# Patient Record
Sex: Female | Born: 1960 | Hispanic: No | Marital: Single
Health system: Southern US, Community
[De-identification: ages and names within clinical notes are randomized; demographics above are authoritative.]

---

## 2006-08-29 ENCOUNTER — Ambulatory Visit: Payer: Self-pay | Admitting: Obstetrics and Gynecology

## 2008-01-02 ENCOUNTER — Ambulatory Visit: Payer: Self-pay | Admitting: Internal Medicine

## 2008-03-22 ENCOUNTER — Ambulatory Visit: Payer: Self-pay | Admitting: Unknown Physician Specialty

## 2009-03-28 ENCOUNTER — Ambulatory Visit: Payer: Self-pay | Admitting: Unknown Physician Specialty

## 2010-03-30 ENCOUNTER — Ambulatory Visit: Payer: Self-pay | Admitting: Cardiovascular Disease

## 2011-04-18 ENCOUNTER — Ambulatory Visit: Payer: Self-pay | Admitting: Unknown Physician Specialty

## 2012-02-08 ENCOUNTER — Ambulatory Visit: Payer: Self-pay

## 2012-02-08 LAB — RAPID STREP-A WITH REFLX: Micro Text Report: NEGATIVE

## 2013-08-25 ENCOUNTER — Ambulatory Visit: Payer: Self-pay | Admitting: Family Medicine

## 2013-09-22 ENCOUNTER — Ambulatory Visit: Payer: Self-pay | Admitting: Emergency Medicine

## 2015-05-30 ENCOUNTER — Other Ambulatory Visit: Payer: Self-pay | Admitting: Family Medicine

## 2015-05-30 DIAGNOSIS — Z1231 Encounter for screening mammogram for malignant neoplasm of breast: Secondary | ICD-10-CM

## 2015-06-02 ENCOUNTER — Ambulatory Visit: Payer: Self-pay | Attending: Family Medicine

## 2015-06-14 ENCOUNTER — Ambulatory Visit: Payer: Self-pay

## 2015-06-14 ENCOUNTER — Ambulatory Visit
Admission: RE | Admit: 2015-06-14 | Discharge: 2015-06-14 | Disposition: A | Discharge status: Home / Self Care | Payer: BLUE CROSS/BLUE SHIELD | Source: Ambulatory Visit | Attending: Family Medicine | Admitting: Family Medicine

## 2015-06-14 DIAGNOSIS — Z1231 Encounter for screening mammogram for malignant neoplasm of breast: Secondary | ICD-10-CM | POA: Diagnosis not present

## 2016-08-03 ENCOUNTER — Other Ambulatory Visit: Payer: Self-pay | Admitting: Family Medicine

## 2016-08-03 DIAGNOSIS — Z1231 Encounter for screening mammogram for malignant neoplasm of breast: Secondary | ICD-10-CM

## 2016-08-08 ENCOUNTER — Ambulatory Visit: Admission: RE | Admit: 2016-08-08 | Payer: BLUE CROSS/BLUE SHIELD | Source: Ambulatory Visit

## 2016-08-24 ENCOUNTER — Ambulatory Visit: Payer: BLUE CROSS/BLUE SHIELD

## 2017-07-29 ENCOUNTER — Other Ambulatory Visit: Payer: Self-pay | Admitting: Family Medicine

## 2017-07-29 DIAGNOSIS — Z1231 Encounter for screening mammogram for malignant neoplasm of breast: Secondary | ICD-10-CM

## 2017-08-05 ENCOUNTER — Inpatient Hospital Stay: Admission: RE | Admit: 2017-08-05 | Payer: BLUE CROSS/BLUE SHIELD | Source: Ambulatory Visit

## 2017-08-06 ENCOUNTER — Other Ambulatory Visit: Payer: Self-pay | Admitting: Family Medicine

## 2017-08-06 ENCOUNTER — Ambulatory Visit: Payer: BLUE CROSS/BLUE SHIELD

## 2017-08-06 DIAGNOSIS — Z1239 Encounter for other screening for malignant neoplasm of breast: Secondary | ICD-10-CM

## 2017-09-04 ENCOUNTER — Ambulatory Visit
Admission: RE | Admit: 2017-09-04 | Discharge: 2017-09-04 | Disposition: A | Discharge status: Home / Self Care | Payer: BLUE CROSS/BLUE SHIELD | Source: Ambulatory Visit | Attending: Family Medicine | Admitting: Family Medicine

## 2017-09-04 DIAGNOSIS — Z1231 Encounter for screening mammogram for malignant neoplasm of breast: Secondary | ICD-10-CM | POA: Insufficient documentation

## 2019-08-13 ENCOUNTER — Other Ambulatory Visit: Payer: Self-pay | Admitting: Family Medicine

## 2019-08-13 DIAGNOSIS — Z1231 Encounter for screening mammogram for malignant neoplasm of breast: Secondary | ICD-10-CM

## 2019-08-31 ENCOUNTER — Encounter (INDEPENDENT_AMBULATORY_CARE_PROVIDER_SITE_OTHER): Payer: Self-pay

## 2019-08-31 ENCOUNTER — Other Ambulatory Visit: Payer: Self-pay

## 2019-08-31 ENCOUNTER — Ambulatory Visit
Admission: RE | Admit: 2019-08-31 | Discharge: 2019-08-31 | Disposition: A | Discharge status: Home / Self Care | Payer: BLUE CROSS/BLUE SHIELD | Source: Ambulatory Visit | Attending: Family Medicine | Admitting: Family Medicine

## 2019-08-31 DIAGNOSIS — Z1231 Encounter for screening mammogram for malignant neoplasm of breast: Secondary | ICD-10-CM | POA: Diagnosis not present

## 2021-01-11 ENCOUNTER — Other Ambulatory Visit: Payer: Self-pay | Admitting: Family Medicine

## 2021-01-11 DIAGNOSIS — Z1231 Encounter for screening mammogram for malignant neoplasm of breast: Secondary | ICD-10-CM

## 2021-01-26 ENCOUNTER — Ambulatory Visit
Admission: RE | Admit: 2021-01-26 | Discharge: 2021-01-26 | Disposition: A | Discharge status: Home / Self Care | Payer: BLUE CROSS/BLUE SHIELD | Source: Ambulatory Visit | Attending: Family Medicine | Admitting: Family Medicine

## 2021-01-26 ENCOUNTER — Other Ambulatory Visit: Payer: Self-pay

## 2021-01-26 DIAGNOSIS — Z1231 Encounter for screening mammogram for malignant neoplasm of breast: Secondary | ICD-10-CM | POA: Diagnosis present

## 2021-08-03 ENCOUNTER — Telehealth: Payer: Self-pay

## 2021-08-03 NOTE — Telephone Encounter (Signed)
This patient called the office and spoke with Ladona Ridgel yesterday: "Kim Valentine called to get an appointment, there was no record of her being here in [facility] or [facility]. She stated that she wanted me to let DR. Roseanne Reno know that she needs to be available more for her patients that are dealing with issues and need to be seen, and that she will be calling back to see if "I passed her message along". Told her there was nothing I could do about the schedule or that Dr. Roseanne Reno is only here Monday and Tuesday, maybe some Wednesdays."  Patient is not in [facility] or [facility] so she would be considered a New Patient and at this time we are scheduling in February.

## 2022-09-07 ENCOUNTER — Other Ambulatory Visit: Payer: Self-pay | Admitting: Family Medicine

## 2022-09-07 DIAGNOSIS — Z1231 Encounter for screening mammogram for malignant neoplasm of breast: Secondary | ICD-10-CM

## 2022-09-27 ENCOUNTER — Ambulatory Visit: Payer: BLUE CROSS/BLUE SHIELD

## 2023-02-09 IMAGING — MG MM DIGITAL SCREENING BILAT W/ TOMO AND CAD
8 series · 8 of 24 positions shown · non-contrast
Comparison: Previous exam(s).

CLINICAL DATA: Screening.

EXAM:
DIGITAL SCREENING BILATERAL MAMMOGRAM WITH TOMOSYNTHESIS AND CAD
TECHNIQUE: Bilateral screening digital craniocaudal and mediolateral oblique
mammograms were obtained. Bilateral screening digital breast
tomosynthesis was performed. The images were evaluated with
computer-aided detection.

[L CC synth-2D]
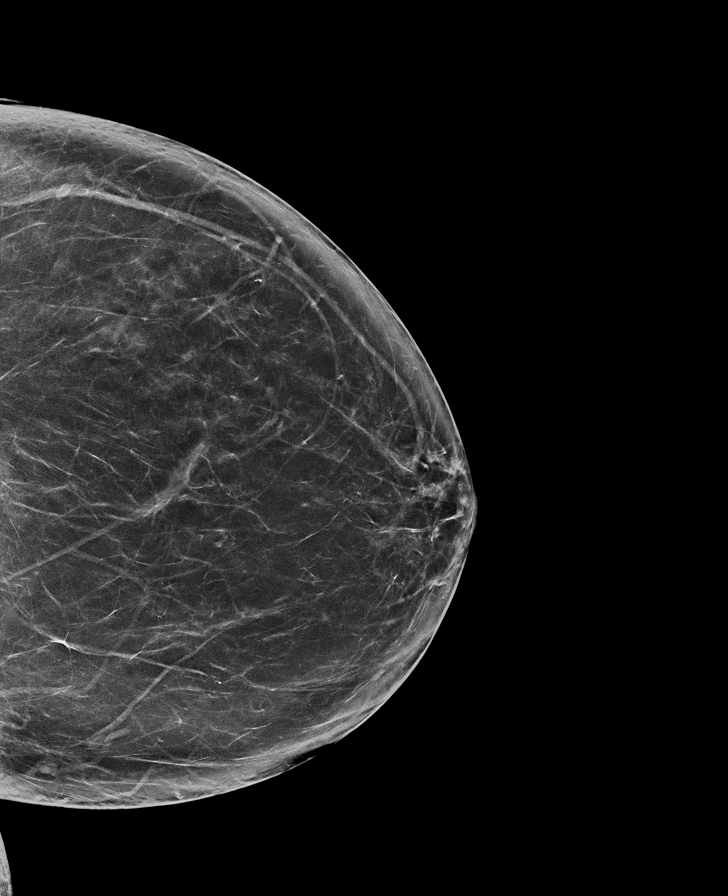

[R MLO synth-2D]
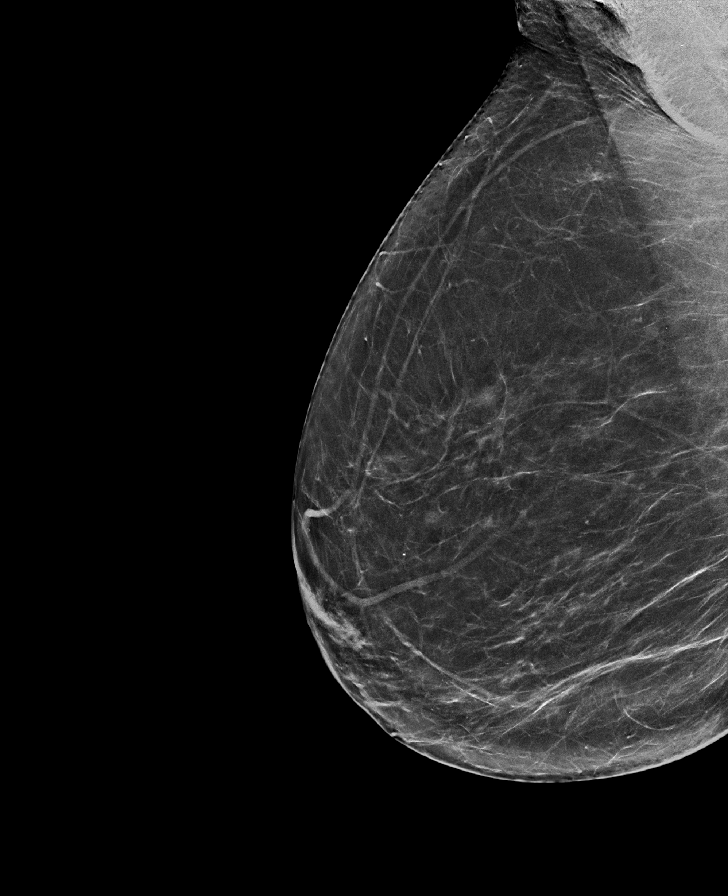

[R CC synth-2D]
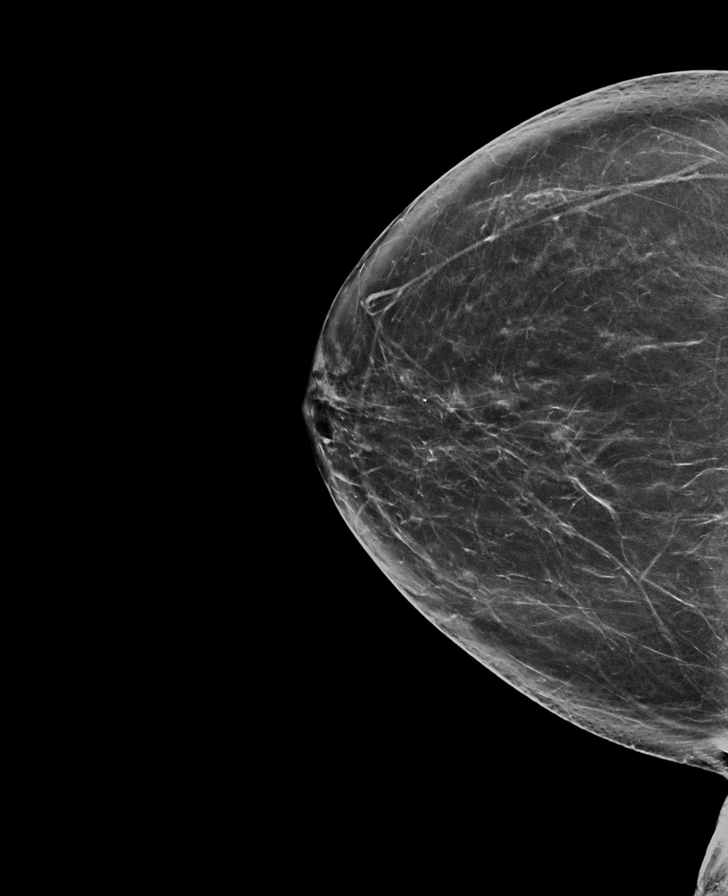

[L MLO synth-2D]
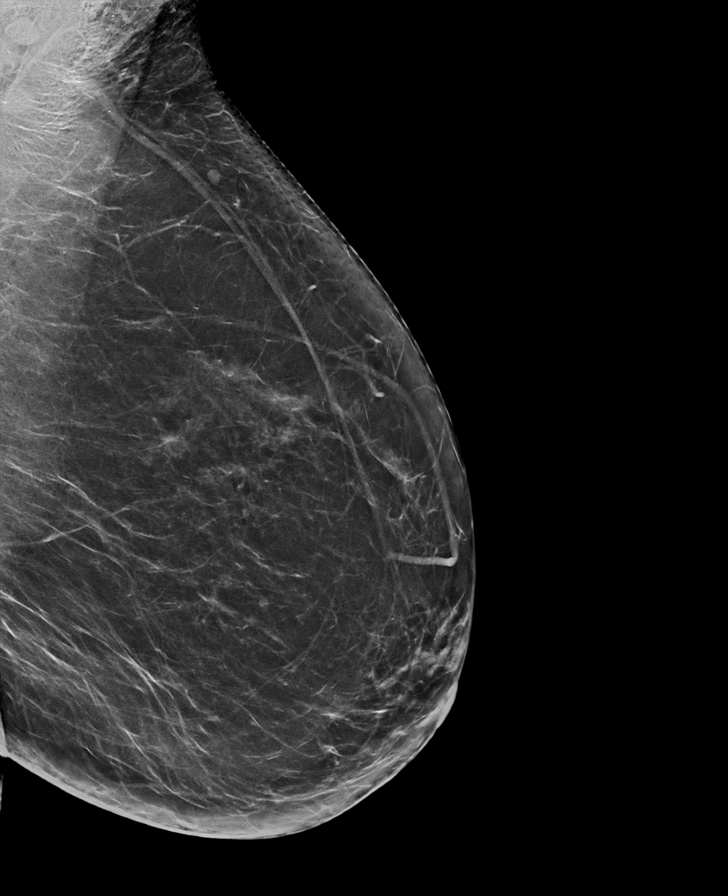

[R CC tomo · tomo slice 37/72.0]
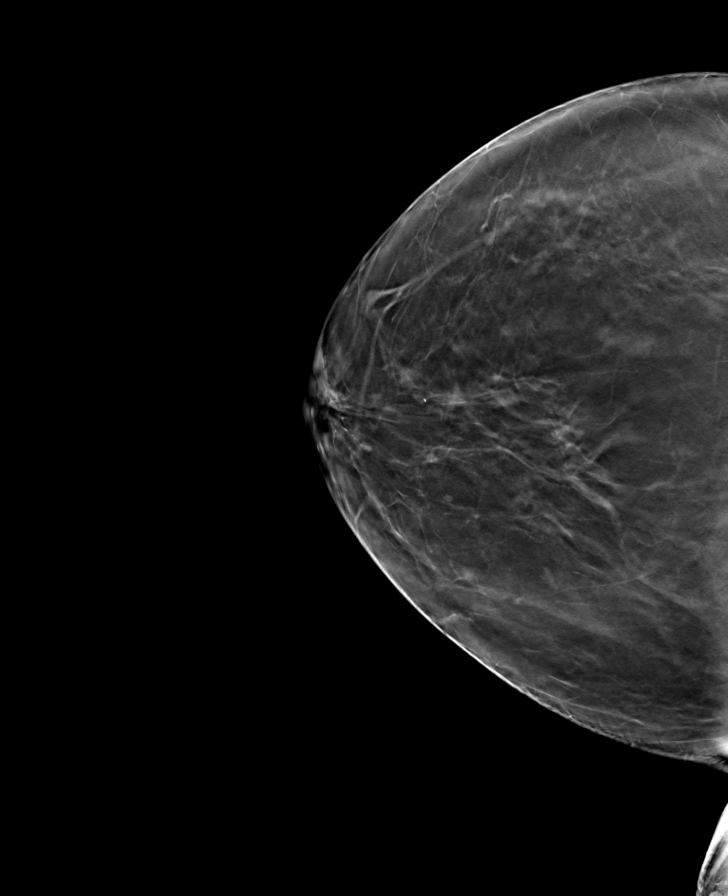

[L CC tomo · tomo slice 39/77.0]
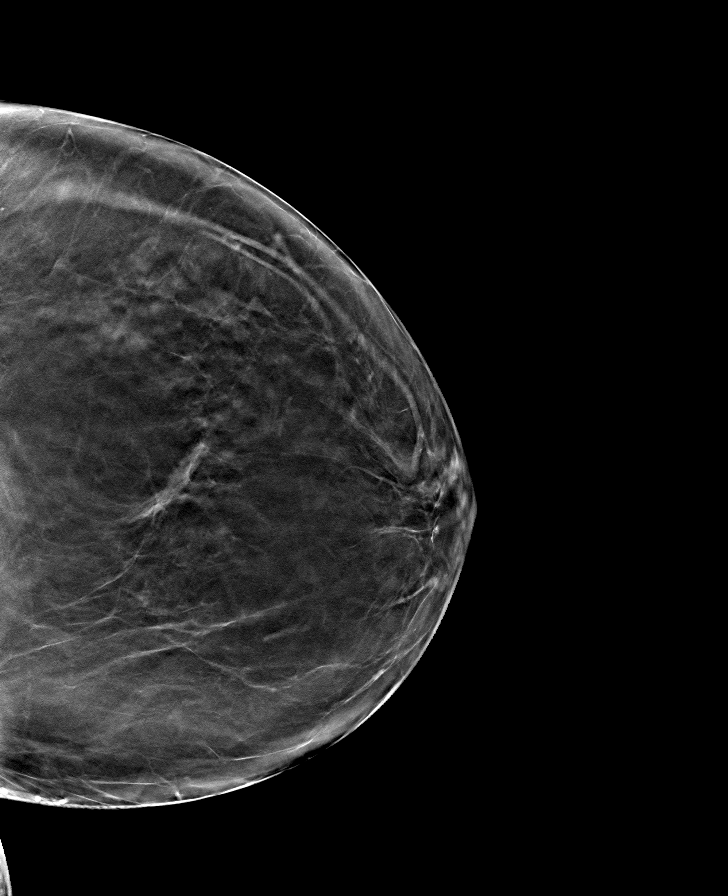

[L MLO tomo · tomo slice 39/77.0]
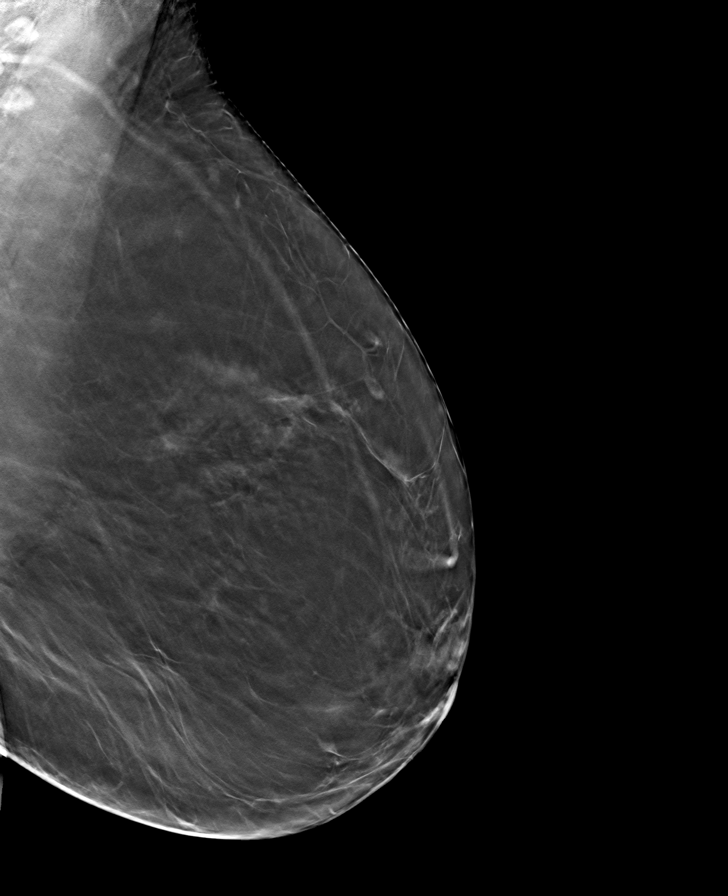

[R MLO tomo · tomo slice 37/72.0]
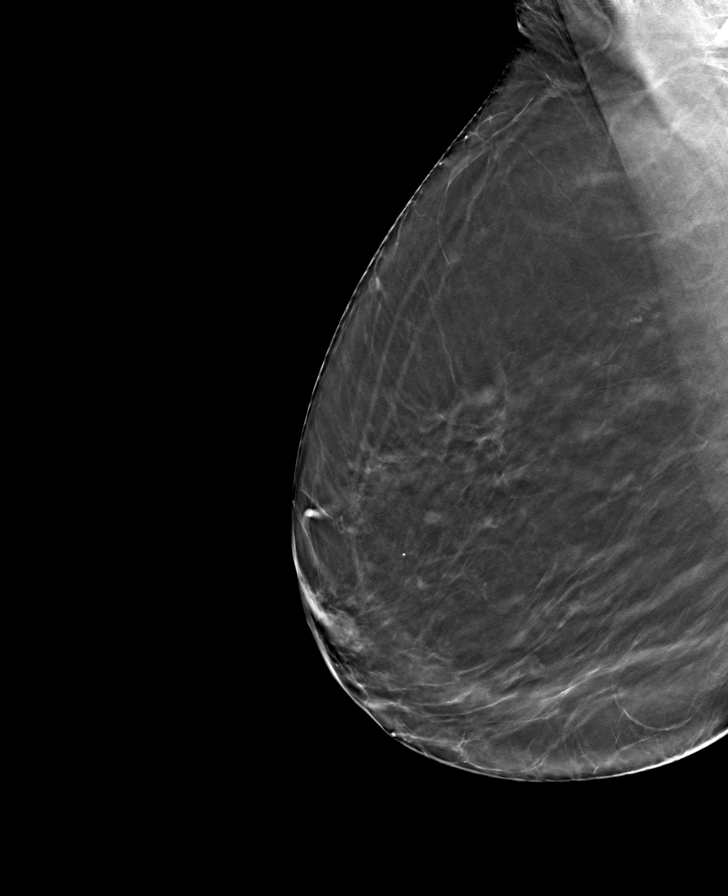

[8 of 24 positions shown; findings below may reference images not displayed]

ACR Breast Density Category b: There are scattered areas of
fibroglandular density.
FINDINGS: There are no findings suspicious for malignancy.
IMPRESSION: No mammographic evidence of malignancy. A result letter of this
screening mammogram will be mailed directly to the patient.

RECOMMENDATION:
Screening mammogram in one year. (Code:51-O-LD2)

BI-RADS CATEGORY  1: Negative.

## 2023-03-28 ENCOUNTER — Ambulatory Visit
Admission: RE | Admit: 2023-03-28 | Discharge: 2023-03-28 | Disposition: A | Discharge status: Home / Self Care | Payer: BC Managed Care – PPO | Source: Ambulatory Visit | Attending: Family Medicine | Admitting: Family Medicine

## 2023-03-28 DIAGNOSIS — Z1231 Encounter for screening mammogram for malignant neoplasm of breast: Secondary | ICD-10-CM

## 2024-06-22 ENCOUNTER — Other Ambulatory Visit: Payer: Self-pay | Admitting: Family Medicine

## 2024-06-22 DIAGNOSIS — Z1231 Encounter for screening mammogram for malignant neoplasm of breast: Secondary | ICD-10-CM

## 2024-07-01 ENCOUNTER — Ambulatory Visit
Admission: RE | Admit: 2024-07-01 | Discharge: 2024-07-01 | Disposition: A | Discharge status: Home / Self Care | Source: Ambulatory Visit | Attending: Family Medicine | Admitting: Family Medicine

## 2024-07-01 DIAGNOSIS — Z1231 Encounter for screening mammogram for malignant neoplasm of breast: Secondary | ICD-10-CM | POA: Insufficient documentation
